# Patient Record
Sex: Male | Born: 1975 | Race: White | Hispanic: No | Marital: Married | Smoking: Never smoker
Health system: Southern US, Community
[De-identification: ages and names within clinical notes are randomized; demographics above are authoritative.]

## PROBLEM LIST (undated history)

## (undated) DIAGNOSIS — Z789 Other specified health status: Secondary | ICD-10-CM

## (undated) HISTORY — PX: BACK SURGERY: SHX140

## (undated) HISTORY — PX: APPENDECTOMY: SHX54

---

## 2005-04-09 ENCOUNTER — Observation Stay: Payer: Self-pay | Admitting: Surgery

## 2010-03-15 ENCOUNTER — Emergency Department: Payer: Self-pay | Admitting: Emergency Medicine

## 2011-04-06 ENCOUNTER — Emergency Department: Payer: Self-pay | Admitting: Emergency Medicine

## 2016-01-29 ENCOUNTER — Encounter: Payer: Self-pay | Admitting: Emergency Medicine

## 2016-01-29 ENCOUNTER — Emergency Department
Admission: EM | Admit: 2016-01-29 | Discharge: 2016-01-29 | Disposition: A | Discharge status: Home / Self Care | Payer: Self-pay | Attending: Emergency Medicine | Admitting: Emergency Medicine

## 2016-01-29 ENCOUNTER — Emergency Department: Payer: Self-pay

## 2016-01-29 DIAGNOSIS — Y929 Unspecified place or not applicable: Secondary | ICD-10-CM | POA: Insufficient documentation

## 2016-01-29 DIAGNOSIS — S92302A Fracture of unspecified metatarsal bone(s), left foot, initial encounter for closed fracture: Secondary | ICD-10-CM

## 2016-01-29 DIAGNOSIS — S92355A Nondisplaced fracture of fifth metatarsal bone, left foot, initial encounter for closed fracture: Secondary | ICD-10-CM | POA: Insufficient documentation

## 2016-01-29 DIAGNOSIS — Y9389 Activity, other specified: Secondary | ICD-10-CM | POA: Insufficient documentation

## 2016-01-29 DIAGNOSIS — Y998 Other external cause status: Secondary | ICD-10-CM | POA: Insufficient documentation

## 2016-01-29 DIAGNOSIS — X501XXA Overexertion from prolonged static or awkward postures, initial encounter: Secondary | ICD-10-CM | POA: Insufficient documentation

## 2016-01-29 MED ORDER — HYDROCODONE-ACETAMINOPHEN 5-325 MG PO TABS: 1.0000 | ORAL_TABLET | Freq: Four times a day (QID) | ORAL | Status: DC | PRN

## 2016-01-29 NOTE — ED Notes (Signed)
States he stepped off the pavement wrong couple of days ago conts to have pain to left lateral foot  Min swelling noted

## 2016-01-29 NOTE — ED Notes (Signed)
Pt c/o left foot pain on top of the foot difficulty putting sock on. Pain started on Friday. Pt states he was playing with his kids and thinks he rolled his foot.

## 2016-01-29 NOTE — Discharge Instructions (Signed)
Metatarsal Fracture A metatarsal fracture is a break in a metatarsal bone. Metatarsal bones connect your toe bones to your ankle bones. CAUSES This type of fracture may be caused by:  A sudden twisting of your foot.  A fall onto your foot.  Overuse or repetitive exercise. RISK FACTORS This condition is more likely to develop in people who:  Play contact sports.  Have a bone disease.  Have a low calcium level. SYMPTOMS Symptoms of this condition include:  Pain that is worse when walking or standing.  Pain when pressing on the foot or moving the toes.  Swelling.  Bruising on the top or bottom of the foot.  A foot that appears shorter than the other one. DIAGNOSIS This condition is diagnosed with a physical exam. You may also have imaging tests, such as:  X-rays.  A CT scan.  MRI. TREATMENT Treatment for this condition depends on its severity and whether a bone has moved out of place. Treatment may involve:  Rest.  Wearing foot support such as a cast, splint, or boot for several weeks.  Using crutches.  Surgery to move bones back into the right position. Surgery is usually needed if there are many pieces of broken bone or bones that are very out of place (displaced fracture).  Physical therapy. This may be needed to help you regain full movement and strength in your foot. You will need to return to your health care provider to have X-rays taken until your bones heal. Your health care provider will look at the X-rays to make sure that your foot is healing well. HOME CARE INSTRUCTIONS  If You Have a Cast:  Do not stick anything inside the cast to scratch your skin. Doing that increases your risk of infection.  Check the skin around the cast every day. Report any concerns to your health care provider. You may put lotion on dry skin around the edges of the cast. Do not apply lotion to the skin underneath the cast.  Keep the cast clean and dry. If You Have a Splint  or a Supportive Boot:  Wear it as directed by your health care provider. Remove it only as directed by your health care provider.  Loosen it if your toes become numb and tingle, or if they turn cold and blue.  Keep it clean and dry. Bathing  Do not take baths, swim, or use a hot tub until your health care provider approves. Ask your health care provider if you can take showers. You may only be allowed to take sponge baths for bathing.  If your health care provider approves bathing and showering, cover the cast or splint with a watertight plastic bag to protect it from water. Do not let the cast or splint get wet. Managing Pain, Stiffness, and Swelling  If directed, apply ice to the injured area (if you have a splint, not a cast).  Put ice in a plastic bag.  Place a towel between your skin and the bag.  Leave the ice on for 20 minutes, 2-3 times per day.  Move your toes often to avoid stiffness and to lessen swelling.  Raise (elevate) the injured area above the level of your heart while you are sitting or lying down. Driving  Do not drive or operate heavy machinery while taking pain medicine.  Do not drive while wearing foot support on a foot that you use for driving. Activity  Return to your normal activities as directed by your health care  provider. Ask your health care provider what activities are safe for you.  Perform exercises as directed by your health care provider or physical therapist. Safety  Do not use the injured foot to support your body weight until your health care provider says that you can. Use crutches as directed by your health care provider. General Instructions  Do not put pressure on any part of the cast or splint until it is fully hardened. This may take several hours.  Do not use any tobacco products, including cigarettes, chewing tobacco, or e-cigarettes. Tobacco can delay bone healing. If you need help quitting, ask your health care  provider.  Take medicines only as directed by your health care provider.  Keep all follow-up visits as directed by your health care provider. This is important. SEEK MEDICAL CARE IF:  You have a fever.  Your cast, splint, or boot is too loose or too tight.  Your cast, splint, or boot is damaged.  Your pain medicine is not helping.  You have pain, tingling, or numbness in your foot that is not going away. SEEK IMMEDIATE MEDICAL CARE IF:  You have severe pain.  You have tingling or numbness in your foot that is getting worse.  Your foot feels cold or becomes numb.  Your foot changes color.   This information is not intended to replace advice given to you by your health care provider. Make sure you discuss any questions you have with your health care provider.   Document Released: 04/21/2002 Document Revised: 12/14/2014 Document Reviewed: 05/26/2014 Elsevier Interactive Patient Education 2016 [place] or Splint Care Casts and splints support injured limbs and keep bones from moving while they heal.  HOME CARE  Keep the cast or splint uncovered during the drying period.  A plaster cast can take 24 to 48 hours to dry.  A fiberglass cast will dry in less than 1 hour.  Do not rest the cast on anything harder than a pillow for 24 hours.  Do not put weight on your injured limb. Do not put pressure on the cast. Wait for your doctor's approval.  Keep the cast or splint dry.  Cover the cast or splint with a plastic bag during baths or wet weather.  If you have a cast over your chest and belly (trunk), take sponge baths until the cast is taken off.  If your cast gets wet, dry it with a towel or blow dryer. Use the cool setting on the blow dryer.  Keep your cast or splint clean. Wash a dirty cast with a damp cloth.  Do not put any objects under your cast or splint.  Do not scratch the skin under the cast with an object. If itching is a problem, use a blow dryer  on a cool setting over the itchy area.  Do not trim or cut your cast.  Do not take out the padding from inside your cast.  Exercise your joints near the cast as told by your doctor.  Raise (elevate) your injured limb on 1 or 2 pillows for the first 1 to 3 days. GET HELP IF:  Your cast or splint cracks.  Your cast or splint is too tight or too loose.  You itch badly under the cast.  Your cast gets wet or has a soft spot.  You have a bad smell coming from the cast.  You get an object stuck under the cast.  Your skin around the cast becomes red or sore.  You have new or more pain after the cast is put on. °GET HELP RIGHT AWAY IF: °· You have fluid leaking through the cast. °· You cannot move your fingers or toes. °· Your fingers or toes turn blue or white or are cool, painful, or puffy (swollen). °· You have tingling or lose feeling (numbness) around the injured area. °· You have bad pain or pressure under the cast. °· You have trouble breathing or have shortness of breath. °· You have chest pain. °  °This information is not intended to replace advice given to you by your health care provider. Make sure you discuss any questions you have with your health care provider. °  °Document Released: 11/29/2010 Document Revised: 04/01/2013 Document Reviewed: 02/05/2013 °Elsevier Interactive Patient Education ©2016 Elsevier Inc. ° °

## 2016-01-29 NOTE — ED Provider Notes (Signed)
[facility] Emergency Department Provider Note  ____________________________________________  Time seen: Approximately 12:59 PM  I have reviewed the triage vital signs and the nursing notes.   HISTORY  Chief Complaint Foot Pain    HPI Philip Gomez is a 40 y.o. male , NAD, presents to the emergency department with several day history of left foot pain. He was playing with his children and believes he rolled his left foot. Has had pain, swelling and bruising about the outside of his foot since that time. Has had difficulty bearing weight due to the pain. Denies open wounds, skin sores, redness. Hasnot had any numbness, weakness, tingling.   History reviewed. No pertinent past medical history.  There are no active problems to display for this patient.   History reviewed. No pertinent past surgical history.  No current outpatient prescriptions on file.  Allergies Penicillins  History reviewed. No pertinent family history.  Social History Social History  Substance Use Topics  . Smoking status: Never Smoker   . Smokeless tobacco: None  . Alcohol Use: Yes     Review of Systems  Constitutional: No fever/chills, fatigue Cardiovascular: No chest pain. Respiratory:  No shortness of breath.  Musculoskeletal: Positive for left foot pain.  Skin: Positive bruising, swelling left foot. Negative for rash or open wounds. Neurological: Negative for headaches, focal weakness or numbness. No tingling 10-point ROS otherwise negative.  ____________________________________________   PHYSICAL EXAM:  VITAL SIGNS: ED Triage Vitals  Enc Vitals Group     BP 01/29/16 1240 147/90 mmHg     Pulse Rate 01/29/16 1240 103     Resp 01/29/16 1240 20     Temp 01/29/16 1240 98.3 F (36.8 C)     Temp Source 01/29/16 1240 Oral     SpO2 01/29/16 1240 100 %     Weight 01/29/16 1240 195 lb (88.451 kg)     Height 01/29/16 1240 6' (1.829 m)     Head Cir --    Peak Flow --      Pain Score 01/29/16 1242 2     Pain Loc --      Pain Edu? --      Excl. in GC? --      Constitutional: Alert and oriented. Well appearing and in no acute distress. Eyes: Conjunctivae are normal.  Head: Atraumatic. Cardiovascular: Good peripheral circulation with 2+ pulses noted in the left lower extremity. Capillary refill is brisk in all edges of the left lower extremity Respiratory: Normal respiratory effort without tachypnea or retractions.  Musculoskeletal: Tenderness to palpation about the lateral left foot with increasing pain about the fifth metatarsal. No crepitus or bony at rally to palpation. Mild edema about the proximal fifth metatarsal. No lower extremity tenderness nor edema.  No joint effusions. Neurologic:  Normal speech and language. No gross focal neurologic deficits are appreciated. No tingling. Sensation to light touch is grossly intact about the left foot. Skin:  About lateral left foot is with mild blue ecchymosis and minor swelling. Skin is warm, dry and intact. No rash noted. Psychiatric: Mood and affect are normal. Speech and behavior are normal. Patient exhibits appropriate insight and judgement.   ____________________________________________   LABS  None ____________________________________________  EKG  None ____________________________________________  RADIOLOGY I have personally viewed and evaluated these images (plain radiographs) as part of my medical decision making, as well as reviewing the written report by the radiologist.  No results found.  ____________________________________________    PROCEDURES  Procedure(s) performed: None  Medications - No data to display   ____________________________________________   INITIAL IMPRESSION / ASSESSMENT AND PLAN / ED COURSE  Pertinent imaging results that were available during my care of the patient were reviewed by me and considered in my medical decision making (see  chart for details).  Patient's diagnosis is consistent with closed fracture of the fifth metatarsal. Patient was placed in a posterior short-leg splint and given crutches to ambulate. He is to keep the left lower extremity elevated when not ambulating and apply ice as needed. She declined prescription for pain medication stating he had an issue with pain medications a few years back. He will take Tylenol and ibuprofen over-the-counter as needed for pain. Patient was given a work note to excuse him until he was cleared with or without restrictions based on his orthopedic follow-up. Patient is to follow up with Dr. Joice Lofts in orthopedics within 3 days for further evaluation and treatment. Patient is given ED precautions to return to the ED for any worsening or new symptoms.    ____________________________________________  FINAL CLINICAL IMPRESSION(S) / ED DIAGNOSES  Final diagnoses:  Fracture of fifth metatarsal bone, left, closed, initial encounter      NEW MEDICATIONS STARTED DURING THIS VISIT:  There are no discharge medications for this patient.        Hope Pigeon, PA-C 01/29/16 1339  Sharyn Creamer, MD 01/29/16 [phone]

## 2016-06-06 NOTE — Discharge Instructions (Signed)
[facility] °[facility] ° °POST OPERATIVE INSTRUCTIONS FOR DR. TROXLER AND DR. FOWLER °[facility] ° ° °1. Take your medication as prescribed.  Pain medication should be taken only as needed. ° °2. Keep the dressing clean, dry and intact. ° °3. Keep your foot elevated above the heart level for the first 48 hours. ° °4. Walking to the bathroom and brief periods of walking are acceptable, unless we have instructed you to be non-weight bearing. ° °5. Always wear your post-op shoe when walking.  Always use your crutches if you are to be non-weight bearing. ° °6. Do not take a shower. Baths are permissible as long as the foot is kept out of the water.  ° °7. Every hour you are awake:  °- Bend your knee 15 times. °- Flex foot 15 times °- Massage calf 15 times ° °8. Call [facility] ([phone]) if any of the following problems occur: °- You develop a temperature or fever. °- The bandage becomes saturated with blood. °- Medication does not stop your pain. °- Injury of the foot occurs. °- Any symptoms of infection including redness, odor, or red streaks running from wound. ° °General Anesthesia, Adult, Care After °Refer to this sheet in the next few weeks. These instructions provide you with information on caring for yourself after your procedure. Your health care provider may also give you more specific instructions. Your treatment has been planned according to current medical practices, but problems sometimes occur. Call your health care provider if you have any problems or questions after your procedure. °WHAT TO EXPECT AFTER THE PROCEDURE °After the procedure, it is typical to experience: °· Sleepiness. °· Nausea and vomiting. °HOME CARE INSTRUCTIONS °· For the first 24 hours after general anesthesia: °¨ Have a responsible person with you. °¨ Do not drive a car. If you are alone, do not take public transportation. °¨ Do not drink alcohol. °¨ Do not take  medicine that has not been prescribed by your health care provider. °¨ Do not sign important papers or make important decisions. °¨ You may resume a normal diet and activities as directed by your health care provider. °· Change bandages (dressings) as directed. °· If you have questions or problems that seem related to general anesthesia, call the hospital and ask for the anesthetist or anesthesiologist on call. °SEEK MEDICAL CARE IF: °· You have nausea and vomiting that continue the day after anesthesia. °· You develop a rash. °SEEK IMMEDIATE MEDICAL CARE IF:  °· You have difficulty breathing. °· You have chest pain. °· You have any allergic problems. °  °This information is not intended to replace advice given to you by your health care provider. Make sure you discuss any questions you have with your health care provider. °  °Document Released: 11/05/2000 Document Revised: 08/20/2014 Document Reviewed: 11/28/2011 °Elsevier Interactive Patient Education ©2016 Elsevier Inc. ° °

## 2016-06-07 ENCOUNTER — Encounter: Payer: Self-pay | Admitting: Anesthesiology

## 2016-06-07 ENCOUNTER — Encounter: Payer: Self-pay | Admitting: *Deleted

## 2016-06-13 ENCOUNTER — Ambulatory Visit: Admission: RE | Admit: 2016-06-13 | Payer: Worker's Compensation | Source: Ambulatory Visit | Admitting: Podiatry

## 2016-06-13 HISTORY — DX: Other specified health status: Z78.9

## 2016-06-13 SURGERY — OPEN REDUCTION INTERNAL FIXATION (ORIF) METATARSAL (TOE) FRACTURE
Anesthesia: General | Laterality: Left

## 2016-07-02 ENCOUNTER — Encounter: Payer: Self-pay | Admitting: *Deleted

## 2016-07-11 ENCOUNTER — Ambulatory Visit: Payer: Worker's Compensation | Admitting: Anesthesiology

## 2016-07-11 ENCOUNTER — Encounter
Admission: RE | Disposition: A | Discharge status: Home / Self Care | Payer: Self-pay | Source: Ambulatory Visit | Attending: Podiatry

## 2016-07-11 ENCOUNTER — Ambulatory Visit
Admission: RE | Admit: 2016-07-11 | Discharge: 2016-07-11 | Disposition: A | Discharge status: Home / Self Care | Payer: Worker's Compensation | Source: Ambulatory Visit | Attending: Podiatry | Admitting: Podiatry

## 2016-07-11 DIAGNOSIS — Z79899 Other long term (current) drug therapy: Secondary | ICD-10-CM | POA: Diagnosis not present

## 2016-07-11 DIAGNOSIS — X58XXXA Exposure to other specified factors, initial encounter: Secondary | ICD-10-CM | POA: Insufficient documentation

## 2016-07-11 DIAGNOSIS — S92352A Displaced fracture of fifth metatarsal bone, left foot, initial encounter for closed fracture: Secondary | ICD-10-CM | POA: Diagnosis present

## 2016-07-11 HISTORY — PX: OPEN REDUCTION INTERNAL FIXATION (ORIF) FOOT LISFRANC FRACTURE: SHX5990

## 2016-07-11 SURGERY — OPEN REDUCTION INTERNAL FIXATION (ORIF) FOOT LISFRANC FRACTURE
Anesthesia: General | Site: Fifth Toe | Laterality: Left | Wound class: Clean

## 2016-07-11 MED ORDER — BUPIVACAINE HCL (PF) 0.25 % IJ SOLN
INTRAMUSCULAR | Status: DC | PRN
  Administered 2016-07-11: 10 mL

## 2016-07-11 MED ORDER — PROPOFOL 10 MG/ML IV BOLUS
INTRAVENOUS | Status: DC | PRN
  Administered 2016-07-11: 150 mg via INTRAVENOUS

## 2016-07-11 MED ORDER — MIDAZOLAM HCL 5 MG/5ML IJ SOLN: INTRAMUSCULAR | Status: DC | PRN

## 2016-07-11 MED ORDER — LIDOCAINE HCL (CARDIAC) 20 MG/ML IV SOLN
INTRAVENOUS | Status: DC | PRN
  Administered 2016-07-11: 40 mg via INTRATRACHEAL

## 2016-07-11 MED ORDER — MIDAZOLAM HCL 2 MG/2ML IJ SOLN
INTRAMUSCULAR | Status: DC | PRN
  Administered 2016-07-11 (×2): 2 mg via INTRAVENOUS

## 2016-07-11 MED ORDER — ROPIVACAINE HCL 5 MG/ML IJ SOLN
INTRAMUSCULAR | Status: DC | PRN
  Administered 2016-07-11: 30 mL via PERINEURAL

## 2016-07-11 MED ORDER — LACTATED RINGERS IV SOLN
INTRAVENOUS | Status: DC | PRN
  Administered 2016-07-11 (×2): via INTRAVENOUS

## 2016-07-11 MED ORDER — FENTANYL CITRATE (PF) 100 MCG/2ML IJ SOLN
INTRAMUSCULAR | Status: DC | PRN
  Administered 2016-07-11: 100 ug via INTRAVENOUS

## 2016-07-11 MED ORDER — IBUPROFEN 800 MG PO TABS: 800.0000 mg | ORAL_TABLET | Freq: Three times a day (TID) | ORAL | 0 refills | Status: DC | PRN

## 2016-07-11 MED ORDER — GLYCOPYRROLATE 0.2 MG/ML IJ SOLN
INTRAMUSCULAR | Status: DC | PRN
  Administered 2016-07-11: 0.1 mg via INTRAVENOUS

## 2016-07-11 MED ORDER — LIDOCAINE-EPINEPHRINE 1 %-1:100000 IJ SOLN
INTRAMUSCULAR | Status: DC | PRN
  Administered 2016-07-11: 5 mL

## 2016-07-11 MED ORDER — DEXTROSE 5 % IV SOLN
600.0000 mg | Freq: Once | INTRAVENOUS | Status: AC
  Administered 2016-07-11: 600 mg via INTRAVENOUS

## 2016-07-11 MED ORDER — ONDANSETRON HCL 4 MG/2ML IJ SOLN
INTRAMUSCULAR | Status: DC | PRN
  Administered 2016-07-11: 4 mg via INTRAVENOUS

## 2016-07-11 MED ORDER — EPHEDRINE SULFATE 50 MG/ML IJ SOLN
INTRAMUSCULAR | Status: DC | PRN
  Administered 2016-07-11 (×3): 5 mg via INTRAVENOUS

## 2016-07-11 MED ORDER — DEXAMETHASONE SODIUM PHOSPHATE 4 MG/ML IJ SOLN
INTRAMUSCULAR | Status: DC | PRN
  Administered 2016-07-11: 4 mg via INTRAVENOUS

## 2016-07-11 SURGICAL SUPPLY — 50 items
BANDAGE ELASTIC 4 LF NS (GAUZE/BANDAGES/DRESSINGS) ×2 IMPLANT
BIT DRILL CANNULATED 3.8MM (DRILL) IMPLANT
BNDG CMPR 75X41 PLY HI ABS (GAUZE/BANDAGES/DRESSINGS) ×1
BNDG CMPR MED 5X4 ELC HKLP NS (GAUZE/BANDAGES/DRESSINGS) ×1
BNDG COHESIVE 4X5 TAN STRL (GAUZE/BANDAGES/DRESSINGS) ×1 IMPLANT
BNDG ESMARK 4X12 TAN STRL LF (GAUZE/BANDAGES/DRESSINGS) ×2 IMPLANT
BNDG GAUZE 4.5X4.1 6PLY STRL (MISCELLANEOUS) ×2 IMPLANT
BNDG STRETCH 4X75 STRL LF (GAUZE/BANDAGES/DRESSINGS) ×2 IMPLANT
CANISTER SUCT 1200ML W/VALVE (MISCELLANEOUS) ×2 IMPLANT
COUNTER SINK 5.5 (MISCELLANEOUS) ×2
CUFF TOURN SGL QUICK 18 (TOURNIQUET CUFF) ×1 IMPLANT
DRAPE FLUOR MINI C-ARM 54X84 (DRAPES) ×2 IMPLANT
DRILL CANNULATED 3.8MM (DRILL) ×2
DURAPREP 26ML APPLICATOR (WOUND CARE) ×2 IMPLANT
GAUZE PETRO XEROFOAM 1X8 (MISCELLANEOUS) ×2 IMPLANT
GAUZE SPONGE 4X4 12PLY STRL (GAUZE/BANDAGES/DRESSINGS) ×2 IMPLANT
GLOVE BIO SURGEON STRL SZ7.5 (GLOVE) ×3 IMPLANT
GLOVE INDICATOR 8.0 STRL GRN (GLOVE) ×3 IMPLANT
GOWN STRL REUS W/ TWL LRG LVL3 (GOWN DISPOSABLE) ×2 IMPLANT
GOWN STRL REUS W/TWL LRG LVL3 (GOWN DISPOSABLE) ×4
JONES SCREW, SOLID 5.5 X 48 (Screw) IMPLANT
K-WIRE DBL END TROCAR 6X.045 (WIRE) ×2
KIT ROOM TURNOVER OR (KITS) ×2 IMPLANT
KWIRE DBL END TROCAR 6X.045 (WIRE) IMPLANT
NDL FILTER BLUNT 18X1 1/2 (NEEDLE) ×1 IMPLANT
NDL HYPO 27GX1-1/4 (NEEDLE) ×1 IMPLANT
NEEDLE FILTER BLUNT 18X 1/2SAF (NEEDLE)
NEEDLE FILTER BLUNT 18X1 1/2 (NEEDLE) IMPLANT
NEEDLE HYPO 27GX1-1/4 (NEEDLE) IMPLANT
NS IRRIG 500ML POUR BTL (IV SOLUTION) ×2 IMPLANT
PACK EXTREMITY ARMC (MISCELLANEOUS) ×2 IMPLANT
PAD GROUND ADULT SPLIT (MISCELLANEOUS) ×2 IMPLANT
SCREW COUNTERSINK 5.5 (MISCELLANEOUS) IMPLANT
SCREW JONES SOLID 5.5X48 (Screw) ×1 IMPLANT
STIMULATOR BONE (ORTHOPEDIC SUPPLIES) ×1
STIMULATOR BONE GROWTH EMG EXT (ORTHOPEDIC SUPPLIES) IMPLANT
STOCKINETTE STRL 6IN 960660 (GAUZE/BANDAGES/DRESSINGS) ×1 IMPLANT
STRAP BODY AND KNEE 60X3 (MISCELLANEOUS) ×2 IMPLANT
STRIP CLOSURE SKIN 1/4X4 (GAUZE/BANDAGES/DRESSINGS) ×2 IMPLANT
SUT ETHILON 4-0 (SUTURE)
SUT ETHILON 4-0 FS2 18XMFL BLK (SUTURE)
SUT ETHILON 5-0 FS-2 18 BLK (SUTURE) IMPLANT
SUT MNCRL+ 5-0 UNDYED PC-3 (SUTURE) IMPLANT
SUT MONOCRYL 5-0 (SUTURE)
SUT VIC AB 4-0 FS2 27 (SUTURE) ×1 IMPLANT
SUT VICRYL AB 3-0 FS1 BRD 27IN (SUTURE) IMPLANT
SUTURE ETHLN 4-0 FS2 18XMF BLK (SUTURE) IMPLANT
SYRINGE 10CC LL (SYRINGE) ×1 IMPLANT
TAP 5.5 (TAP) ×1 IMPLANT
WIRE SMOOTH NITINOL 1.6X200 (WIRE) ×1 IMPLANT

## 2016-07-11 NOTE — Addendum Note (Signed)
Addendum  created 07/11/16 1624 by Scarlette Sliceachel B Beach, MD   Anesthesia Intra Blocks edited, Child order released for a procedure order, Sign clinical note

## 2016-07-11 NOTE — Anesthesia Preprocedure Evaluation (Signed)
Anesthesia Evaluation  Patient identified by MRN, date of birth, ID band Patient awake    Reviewed: Allergy & Precautions, H&P , NPO status , Patient's Chart, lab work & pertinent test results, reviewed documented beta blocker date and time   Airway Mallampati: II  TM Distance: >3 FB Neck ROM: full    Dental no notable dental hx.    Pulmonary neg pulmonary ROS,    Pulmonary exam normal breath sounds clear to auscultation       Cardiovascular Exercise Tolerance: Good hypertension,  Rhythm:regular Rate:Normal     Neuro/Psych negative neurological ROS  negative psych ROS   GI/Hepatic negative GI ROS, Neg liver ROS,   Endo/Other  negative endocrine ROS  Renal/GU negative Renal ROS  negative genitourinary   Musculoskeletal   Abdominal   Peds  Hematology negative hematology ROS (+)   Anesthesia Other Findings   Reproductive/Obstetrics negative OB ROS                             Anesthesia Physical Anesthesia Plan  ASA: II  Anesthesia Plan: General   Post-op Pain Management: GA combined w/ Regional for post-op pain   Induction:   Airway Management Planned:   Additional Equipment:   Intra-op Plan:   Post-operative Plan:   Informed Consent: I have reviewed the patients History and Physical, chart, labs and discussed the procedure including the risks, benefits and alternatives for the proposed anesthesia with the patient or authorized representative who has indicated his/her understanding and acceptance.   Dental Advisory Given  Plan Discussed with: CRNA  Anesthesia Plan Comments:         Anesthesia Quick Evaluation

## 2016-07-11 NOTE — H&P (Signed)
  Philip Gomez is an 40 y.o. male.   Chief Complaint:  <principal problem not specified>   HPI: Pt with left foot Jones fracture.  Past Medical History:  Diagnosis Date  . Medical history non-contributory     Past Surgical History:  Procedure Laterality Date  . APPENDECTOMY    . BACK SURGERY     L4-5 "scraped"    History reviewed. No pertinent family history. Social History:  reports that he has never smoked. He has never used smokeless tobacco. He reports that he drinks about 3.6 oz of alcohol per week . His drug history is not on file.  Allergies:  Allergies  Allergen Reactions  . Penicillins     Unknown reaction (as child)    ROS  Medications Prior to Admission  Medication Sig Dispense Refill  . dexmethylphenidate (FOCALIN) 10 MG tablet Take 10 mg by mouth 2 (two) times daily.      Physical Exam: General: Alert and oriented.  No apparent distress. CV:  RRR Lungs:  CTA b/l Vascular:  Left foot:Dorsalis Pedis:  present Posterior Tibial:  present  Right foot: not evaluated Neuro:intact Derm:No open wound Ortho/MS: Pain 5th metatarsal base.     No results found for this or any previous visit (from the past 48 hour(s)). No results found.  Blood pressure (!) 140/95, pulse 91, temperature 98.4 F (36.9 C), temperature source Tympanic, resp. rate 16, height 6\' 1"  (1.854 m), weight 93 kg (205 lb), SpO2 98 %.  Assessment/Plan Jones fracture left   Plan for ORIF . No changes in medical history.  Gwyneth RevelsFowler, Angus Amini A 07/11/2016, 1:56 PM

## 2016-07-11 NOTE — Progress Notes (Signed)
[facility] ultrasound guided, popliteal block. Side rails up, monitors on throughout procedure. See vital signs in flow sheet. Tolerated Procedure well.

## 2016-07-11 NOTE — Discharge Instructions (Signed)
[facility]  POST OPERATIVE INSTRUCTIONS FOR DR. TROXLER AND DR. Genevieve NorlanderFOWLER [facility]   1. Take your medication as prescribed.  Pain medication should be taken only as needed.  2. Keep the dressing clean, dry and intact.  3. Keep your foot elevated above the heart level for the first 48 hours.  4. Walking to the bathroom and brief periods of walking are acceptable, unless we have instructed you to be non-weight bearing.  5. Always wear your post-op shoe when walking.  Always use your crutches if you are to be non-weight bearing.  6. Every hour you are awake:  - Bend your knee 15 times. - Massage calf 15 times  7. [facility] [phone]) if any of the following problems occur: - You develop a temperature or fever. - The bandage becomes saturated with blood. - Medication does not stop your pain. - Injury of the foot occurs. - Any symptoms of infection including redness, odor, or red streaks running from wound.  General Anesthesia, Adult, Care After These instructions provide you with information about caring for yourself after your procedure. Your health care provider may also give you more specific instructions. Your treatment has been planned according to current medical practices, but problems sometimes occur. Call your health care provider if you have any problems or questions after your procedure. What can I expect after the procedure? After the procedure, it is common to have:  Vomiting.  A sore throat.  Mental slowness. It is common to feel:  Nauseous.  Cold or shivery.  Sleepy.  Tired.  Sore or achy, even in parts of your body where you did not have surgery. Follow these instructions at home: For at least 24 hours after the procedure:  Do not:  Participate in activities where you could fall or become injured.  Drive.  Use heavy machinery.  Drink alcohol.  Take sleeping  pills or medicines that cause drowsiness.  Make important decisions or sign legal documents.  Take care of children on your own.  Rest. Eating and drinking  If you vomit, drink water, juice, or soup when you can drink without vomiting.  Drink enough fluid to keep your urine clear or pale yellow.  Make sure you have little or no nausea before eating solid foods.  Follow the diet recommended by your health care provider. General instructions  Have a responsible adult stay with you until you are awake and alert.  Return to your normal activities as told by your health care provider. Ask your health care provider what activities are safe for you.  Take over-the-counter and prescription medicines only as told by your health care provider.  If you smoke, do not smoke without supervision.  Keep all follow-up visits as told by your health care provider. This is important. Contact a health care provider if:  You continue to have nausea or vomiting at home, and medicines are not helpful.  You cannot drink fluids or start eating again.  You cannot urinate after 8-12 hours.  You develop a skin rash.  You have fever.  You have increasing redness at the site of your procedure. Get help right away if:  You have difficulty breathing.  You have chest pain.  You have unexpected bleeding.  You feel that you are having a life-threatening or urgent problem. This information is not intended to replace advice given to you by your health care provider. Make sure you discuss any questions you  have with your health care provider. Document Released: 11/05/2000 Document Revised: 01/02/2016 Document Reviewed: 07/14/2015 Elsevier Interactive Patient Education  2017 [employer]

## 2016-07-11 NOTE — Anesthesia Postprocedure Evaluation (Signed)
Anesthesia Post Note  Patient: Alvina FilbertSteven L Maser  Procedure(s) Performed: Procedure(s) (LRB): OPEN REDUCTION INTERNAL FIXATION (ORIF) FOOT METATARSAL FRACTURE 5TH LEFT FOOT (Left)  Patient location during evaluation: PACU Anesthesia Type: General and Regional Level of consciousness: awake and alert Pain management: pain level controlled Vital Signs Assessment: post-procedure vital signs reviewed and stable Respiratory status: spontaneous breathing, nonlabored ventilation, respiratory function stable and patient connected to nasal cannula oxygen Cardiovascular status: blood pressure returned to baseline and stable Postop Assessment: no signs of nausea or vomiting Anesthetic complications: no    Scarlette Sliceachel B Beach

## 2016-07-11 NOTE — Transfer of Care (Signed)
Immediate Anesthesia Transfer of Care Note  Patient: Philip Gomez  Procedure(s) Performed: Procedure(s) with comments: OPEN REDUCTION INTERNAL FIXATION (ORIF) FOOT METATARSAL FRACTURE 5TH LEFT FOOT (Left) - POPLITEAL  Patient Location: PACU  Anesthesia Type: General  Level of Consciousness: awake, alert  and patient cooperative  Airway and Oxygen Therapy: Patient Spontanous Breathing and Patient connected to supplemental oxygen  Post-op Assessment: Post-op Vital signs reviewed, Patient's Cardiovascular Status Stable, Respiratory Function Stable, Patent Airway and No signs of Nausea or vomiting  Post-op Vital Signs: Reviewed and stable  Complications: No apparent anesthesia complications

## 2016-07-11 NOTE — Anesthesia Procedure Notes (Addendum)
Procedure Name: LMA Insertion Date/Time: 07/11/2016 2:15 PM Performed by: Jimmy PicketAMYOT, Tyresha Fede Pre-anesthesia Checklist: Patient identified, Emergency Drugs available, Suction available, Timeout performed and Patient being monitored Patient Re-evaluated:Patient Re-evaluated prior to inductionOxygen Delivery Method: Circle system utilized Preoxygenation: Pre-oxygenation with 100% oxygen Intubation Type: IV induction LMA: LMA inserted LMA Size: 4.0 Number of attempts: 1 Placement Confirmation: positive ETCO2 and breath sounds checked- equal and bilateral Tube secured with: Tape

## 2016-07-11 NOTE — Addendum Note (Signed)
Addendum  created 07/11/16 1627 by Scarlette Sliceachel B Dyanne Yorks, MD   Anesthesia Review and Sign - Ready for Procedure

## 2016-07-11 NOTE — Addendum Note (Signed)
Addendum  created 07/11/16 1626 by Scarlette Sliceachel B Beach, MD   Anesthesia Intra Meds edited

## 2016-07-11 NOTE — Op Note (Signed)
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 Open reduction with internal fixation nonunion Jones fracture with intramedullary screw 2. Placement of bone stimulator external    EBL: Minimal    Anesthesia:regional and general    Hemostasis: Epinephrine infiltrated along the incision site    Specimen: None    Complications: None    Operative indications:Philip L Creta LevinStallings is an 40 y.o. that presents today for surgical intervention.  The risks/benefits/alternatives/complications have been discussed and consent has been given.    Procedure:  Patient was brought into the OR and placed on the operating table in thesupine position. After anesthesia was obtained theleft lower extremity was prepped and draped in usual sterile fashion.  Attention was directed to the dorsal lateral aspect of the left foot where just proximal to the fifth metatarsal base a 2 cm incision was performed. Sharp and blunt dissection carried down to the deeper tissue. Care was taken to retract all vital neurovascular structures. The peroneal tendon was noted at this time. This time the guidewire for the 5.5 mm screw was then entered into the base of the fifth metatarsal. The standard high and inside approach could not be used secondary to the angulation of the fifth metatarsal at the fracture site. A more lateral fifth metatarsal base insertional site was used. A limited across the fracture site with the guidewire. Next a countersink was used. The area was drilled and tapped for a 5.5 solid screw. The guidewire was then removed. A 48 mm x 5.5 mm solid screw was used. Good alignment was noted. This was noted be very stable Intra-Op. Screw threads were across the fracture site. Multiple views of fluoroscopy did no good alignment of the screw. Time the wound was flushed with copious amounts or irrigation.  Layered closure was performed with a 4-0 Vicryl for the deeper layers and a 5-0 Monocryl undyed. 0.25% Marcaine was used around all areas. A well compressive sterile dressing was applied. After placement of his walking boot I placed a external bone stimulator.    Patient tolerated the procedure and anesthesia well.  Was transported from the OR to the PACU with all vital signs stable and vascular status intact. To be discharged per routine protocol.  Will follow up in approximately 1 week in the outpatient clinic.

## 2016-07-11 NOTE — Anesthesia Procedure Notes (Signed)
Anesthesia Regional Block:  Popliteal block  Pre-Anesthetic Checklist: ,, timeout performed, Correct Patient, Correct Site, Correct Laterality, Correct Procedure, Correct Position, site marked, Risks and benefits discussed,  Surgical consent,  Pre-op evaluation,  At surgeon's request and post-op pain management  Laterality: Left  Prep: chloraprep       Needles:  Injection technique: Single-shot  Needle Type: Echogenic Needle     Needle Length: 9cm 9 cm Needle Gauge: 21 and 21 G    Additional Needles:  Procedures: ultrasound guided (picture in chart) Popliteal block Narrative:  Start time: 07/11/2016 1:00 PM End time: 07/11/2016 1:10 PM Injection made incrementally with aspirations every 5 mL.  Performed by: Personally   Additional Notes: Functioning IV was confirmed and monitors applied. Ultrasound guidance: relevant anatomy identified, needle position confirmed, local anesthetic spread visualized around nerve(s)., vascular puncture avoided.  Image printed for medical record.  Negative aspiration and no paresthesias; incremental administration of local anesthetic. The patient tolerated the procedure well. Vitals signes recorded in RN notes.

## 2016-07-12 ENCOUNTER — Encounter: Payer: Self-pay | Admitting: Podiatry

## 2016-07-23 NOTE — Addendum Note (Signed)
Addendum  created 07/23/16 1350 by Andee PolesWendy Willadean Guyton, CRNA   Anesthesia Intra Meds edited

## 2016-10-28 IMAGING — DX DG FOOT COMPLETE 3+V*L*
3 series · 3 of 3 positions shown · non-contrast
Comparison: None.

CLINICAL DATA: Pt c/o left foot pain on top of the foot difficulty
putting sock on. Pain started on [REDACTED]. Pt states he was playing
with his kids and thinks he rolled his foot.

EXAM:
LEFT FOOT - COMPLETE 3+ VIEW

[foot ap]
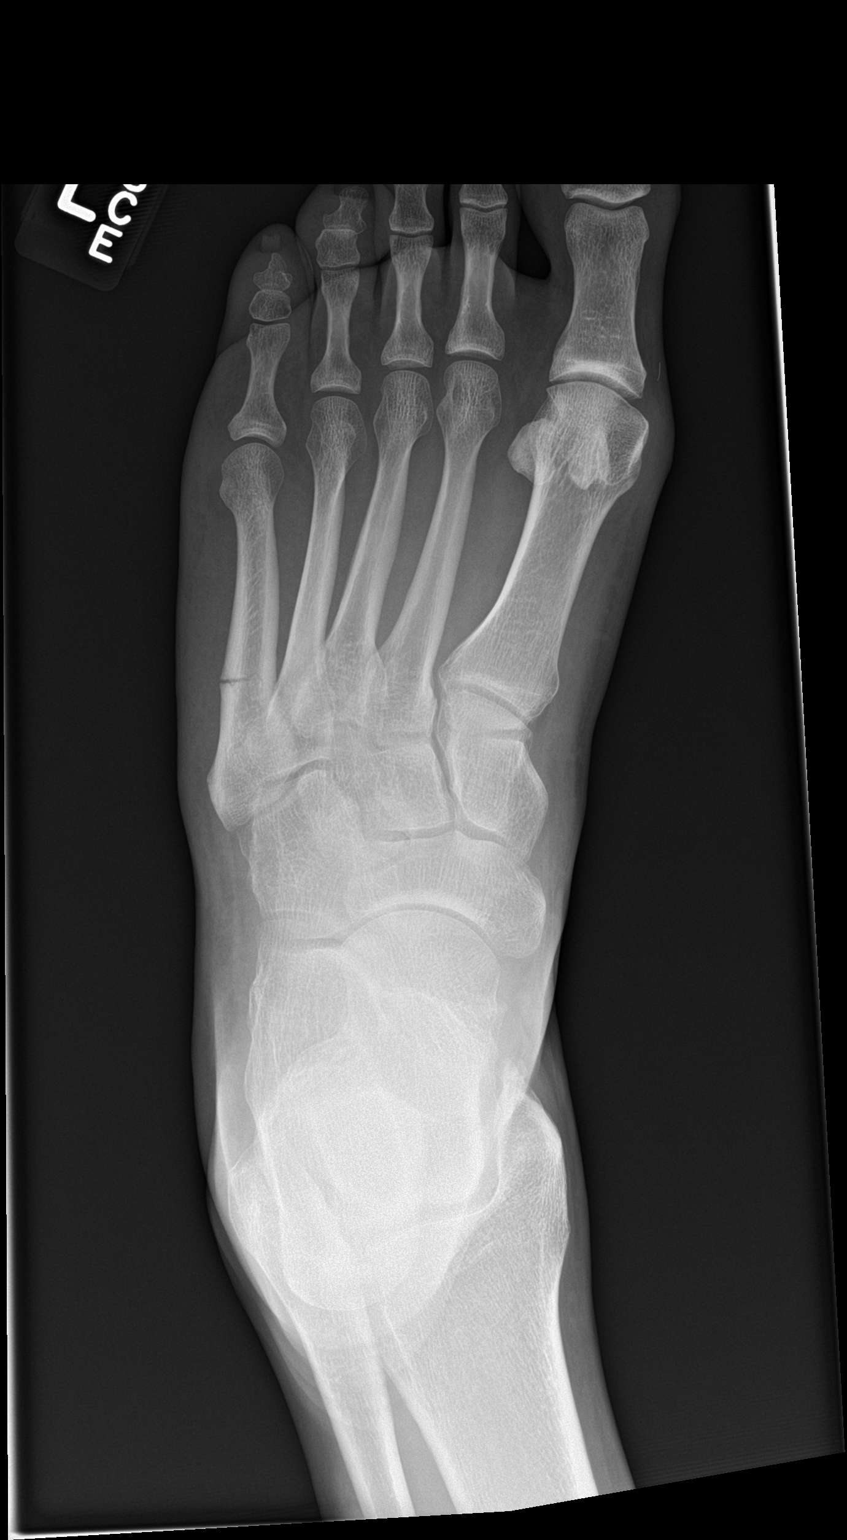

[foot obl]
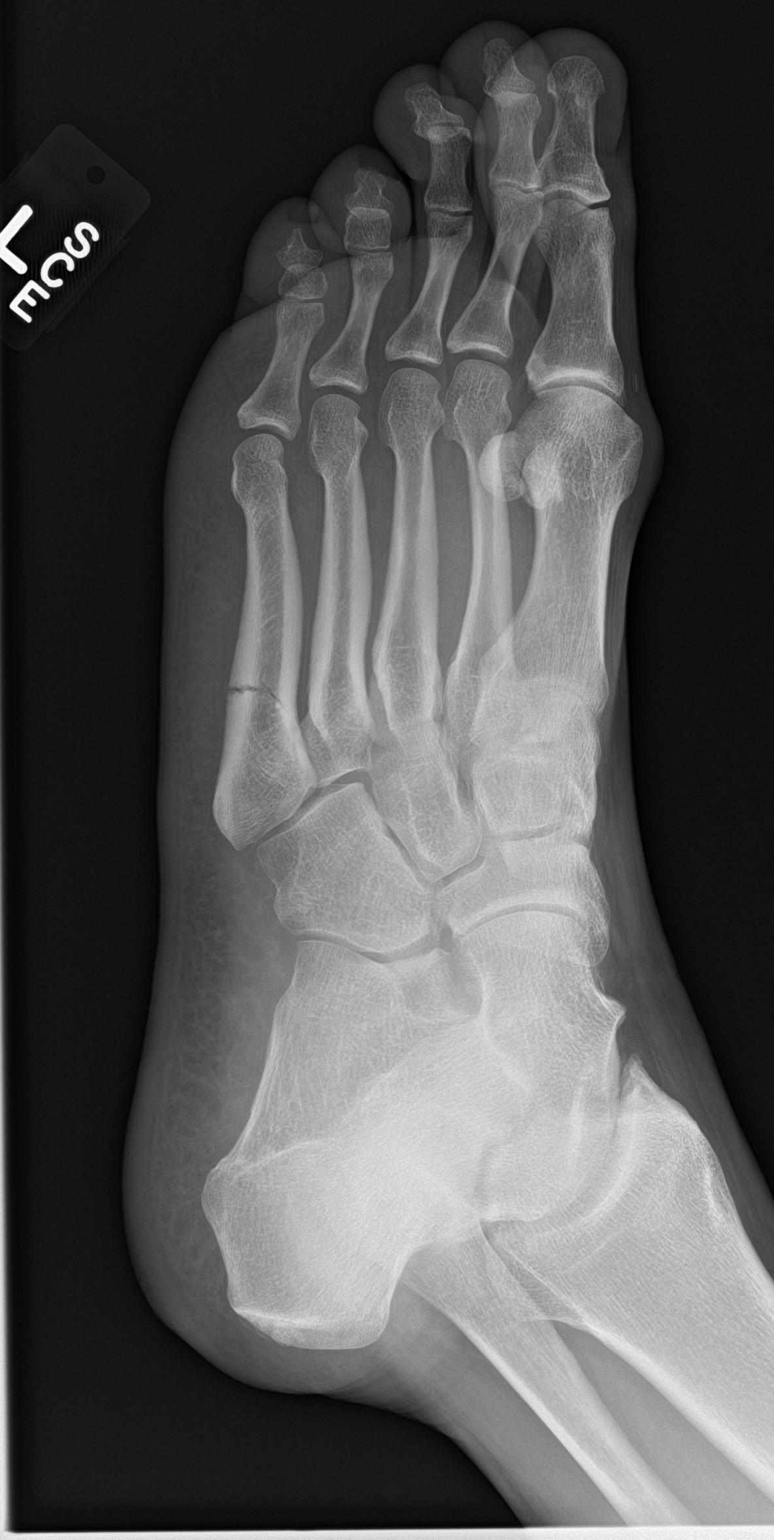

[foot lat]
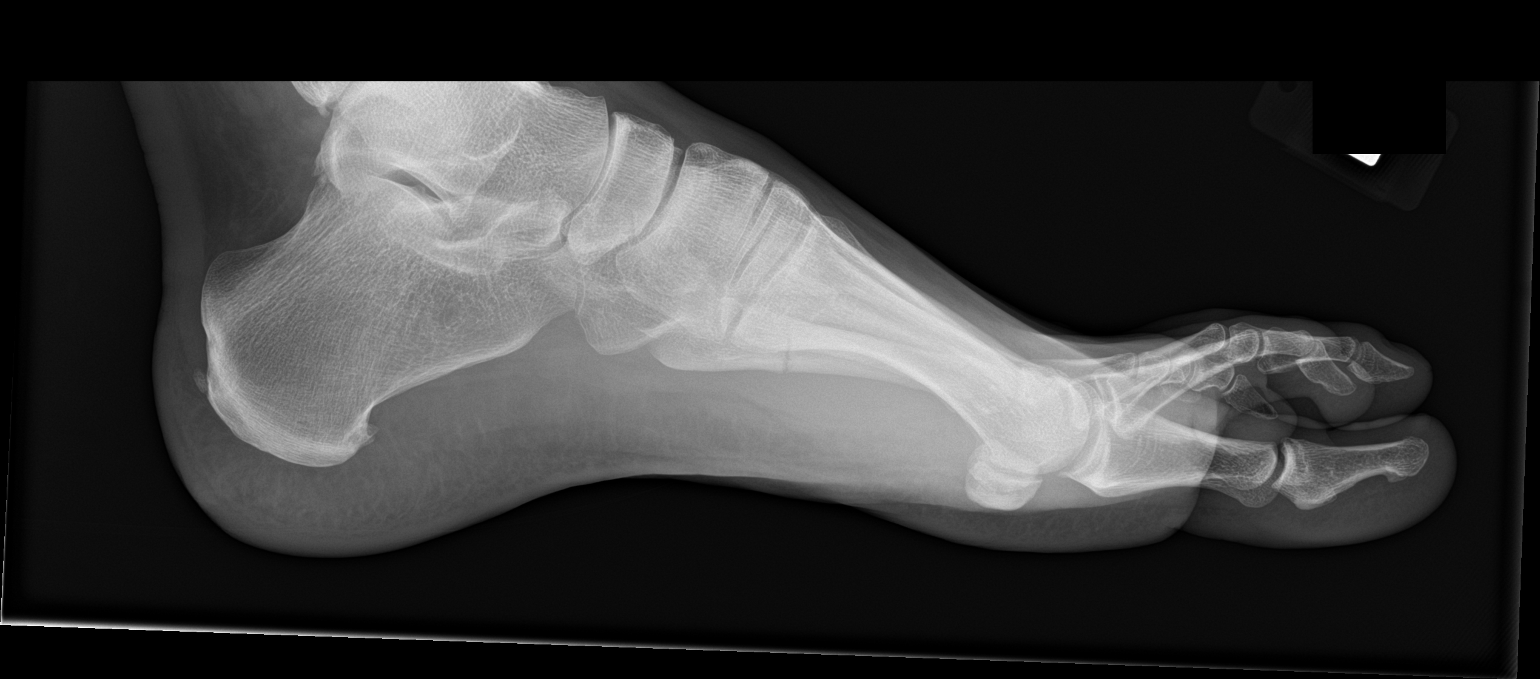

[3 of 3 positions shown; findings below may reference images not displayed]

FINDINGS: There is a nondisplaced fracture of the proximal shaft of the fifth
metatarsal.

No other fractures.  Joints are normally spaced and aligned.
IMPRESSION: Nondisplaced fracture of the proximal shaft of the left fifth
metatarsal.

## 2017-01-30 ENCOUNTER — Emergency Department
Admission: EM | Admit: 2017-01-30 | Discharge: 2017-01-30 | Disposition: A | Discharge status: Home / Self Care | Payer: Self-pay | Attending: Emergency Medicine | Admitting: Emergency Medicine

## 2017-01-30 ENCOUNTER — Emergency Department: Payer: Self-pay

## 2017-01-30 ENCOUNTER — Encounter: Payer: Self-pay | Admitting: Medical Oncology

## 2017-01-30 DIAGNOSIS — Z791 Long term (current) use of non-steroidal anti-inflammatories (NSAID): Secondary | ICD-10-CM | POA: Insufficient documentation

## 2017-01-30 DIAGNOSIS — M79672 Pain in left foot: Secondary | ICD-10-CM | POA: Insufficient documentation

## 2017-01-30 NOTE — ED Provider Notes (Signed)
[facility] Emergency Department Provider Note   ____________________________________________   First MD Initiated Contact with Patient 01/30/17 [phone]     (approximate)  I have reviewed the triage vital signs and the nursing notes.   HISTORY  Chief Complaint Foot Pain    HPI Philip Gomez is a 41 y.o. male patient complaining of continued left foot pain status post surgery performed in October last year. Patient to internal fixation of the fifth metatarsal. Patient state he noticed lateral foot swelling and increased pain with radiation issues and prolonged standing. Patient bleed internal fixation has dislodged.patient rates his pain as a 3/10. Patient has not discussed this complaint with his treating podiatrist.   Past Medical History:  Diagnosis Date  . Medical history non-contributory     There are no active problems to display for this patient.   Past Surgical History:  Procedure Laterality Date  . APPENDECTOMY    . BACK SURGERY     L4-5 "scraped"  . OPEN REDUCTION INTERNAL FIXATION (ORIF) FOOT LISFRANC FRACTURE Left 07/11/2016   Procedure: OPEN REDUCTION INTERNAL FIXATION (ORIF) FOOT METATARSAL FRACTURE 5TH LEFT FOOT;  Surgeon: Gwyneth Revels, DPM;  Location: [facility];  Service: Podiatry;  Laterality: Left;  POPLITEAL    Prior to Admission medications   Medication Sig Start Date End Date Taking? Authorizing Provider  dexmethylphenidate (FOCALIN) 10 MG tablet Take 10 mg by mouth 2 (two) times daily.    [provider]  ibuprofen (ADVIL,MOTRIN) 800 MG tablet Take 1 tablet (800 mg total) by mouth every 8 (eight) hours as needed. 07/11/16   Gwyneth Revels, DPM    Allergies Penicillins  No family history on file.  Social History Social History  Substance Use Topics  . Smoking status: Never Smoker  . Smokeless tobacco: Never Used  . Alcohol use 3.6 oz/week    6 Cans of beer per week    Review of  Systems  Constitutional: No fever/chills Eyes: No visual changes. ENT: No sore throat. Cardiovascular: Denies chest pain. Respiratory: Denies shortness of breath. Gastrointestinal: No abdominal pain.  No nausea, no vomiting.  No diarrhea.  No constipation. Genitourinary: Negative for dysuria. Musculoskeletal: left foot pain Skin: Negative for rash. Neurological: Negative for headaches, focal weakness or numbness.   ____________________________________________   PHYSICAL EXAM:  VITAL SIGNS: ED Triage Vitals  Enc Vitals Group     BP 01/30/17 0757 (!) 130/95     Pulse Rate 01/30/17 0757 91     Resp 01/30/17 0757 18     Temp 01/30/17 0757 98 F (36.7 C)     Temp Source 01/30/17 0757 Oral     SpO2 01/30/17 0757 98 %     Weight 01/30/17 0757 215 lb (97.5 kg)     Height 01/30/17 0757 6' (1.829 m)     Head Circumference --      Peak Flow --      Pain Score 01/30/17 0756 3     Pain Loc --      Pain Edu? --      Excl. in GC? --     Constitutional: Alert and oriented. Well appearing and in no acute distress. Cardiovascular: Normal rate, regular rhythm. Grossly normal heart sounds.  Good peripheral circulation. Respiratory: Normal respiratory effort.  No retractions. Lungs CTAB. Musculoskeletal:no obvious deformity of the left foot. There is a palpable lesion lateral aspect of the left foot. Moderate guarding palpation the lateral foot. Neurologic:  Normal speech and language. No  gross focal neurologic deficits are appreciated. No gait instability. Skin:  Skin is warm, dry and intact. No rash noted. Psychiatric: Mood and affect are normal. Speech and behavior are normal.  ____________________________________________   LABS (all labs ordered are listed, but only abnormal results are displayed)  Labs Reviewed - No data to display ____________________________________________  EKG   ____________________________________________  RADIOLOGY  Dg Foot Complete Left  Result  Date: 01/30/2017 CLINICAL DATA:  Left foot pain after surgery in November. Swelling. No injury. EXAM: LEFT FOOT - COMPLETE 3+ VIEW COMPARISON:  None. FINDINGS: A single screw traverses a healed fracture of the fifth metatarsal shaft. There is bony overgrowth laterally, which may contribute to the reported sensation of swelling. Mild fragmentation of the base of the fifth metatarsal, likely postoperative in etiology. Calcaneal spurs. IMPRESSION: Single screw fixation of a healed fifth metatarsal shaft fracture without acute finding. Electronically Signed   By: Leanna BattlesMelinda  Blietz M.D.   On: 01/30/2017 08:39    ____________________________________________   PROCEDURES  Procedure(s) performed: None  Procedures  Critical Care performed: No  ____________________________________________   INITIAL IMPRESSION / ASSESSMENT AND PLAN / ED COURSE  Pertinent labs & imaging results that were available during my care of the patient were reviewed by me and considered in my medical decision making (see chart for details).  Continual left foot pain status post internal fixation. Discussed x-ray findings of with patient showing extra bone growth lateral to the internal fixation. Advised to follow-up with podiatry consider purchasing a wider shoe      ____________________________________________   FINAL CLINICAL IMPRESSION(S) / ED DIAGNOSES  Final diagnoses:  Foot pain, left      NEW MEDICATIONS STARTED DURING THIS VISIT:  Discharge Medication List as of 01/30/2017  8:54 AM       Note:  This document was prepared using Dragon voice recognition software and may include unintentional dictation errors.    Joni ReiningSmith, Ronald K, PA-C 01/30/17 [id number]    Nita SickleVeronese, [place], MD 02/02/17 1302

## 2017-01-30 NOTE — ED Triage Notes (Signed)
Pt reports he had a left foot injury in October of last year and since then pt has been having issues with his left foot hurting. Pt had surgery on foot for injury with screws placed

## 2017-01-30 NOTE — Discharge Instructions (Signed)
Considered purchase over a wide his shoe to accommodate actual bone growth of left foot.

## 2017-01-30 NOTE — ED Notes (Signed)
Pt to ed with c/o left foot pain and swelling.  Reports surgery on foot in 10/17.  No new injury noted per pt.  Pt with swelling to left outer aspect of foot and mild redness noted.

## 2017-05-06 ENCOUNTER — Emergency Department: Payer: Self-pay

## 2017-05-06 ENCOUNTER — Encounter: Payer: Self-pay | Admitting: Emergency Medicine

## 2017-05-06 ENCOUNTER — Emergency Department
Admission: EM | Admit: 2017-05-06 | Discharge: 2017-05-06 | Disposition: A | Discharge status: Home / Self Care | Payer: Self-pay | Attending: Emergency Medicine | Admitting: Emergency Medicine

## 2017-05-06 DIAGNOSIS — Z880 Allergy status to penicillin: Secondary | ICD-10-CM | POA: Insufficient documentation

## 2017-05-06 DIAGNOSIS — Z79899 Other long term (current) drug therapy: Secondary | ICD-10-CM | POA: Insufficient documentation

## 2017-05-06 DIAGNOSIS — R0789 Other chest pain: Secondary | ICD-10-CM | POA: Insufficient documentation

## 2017-05-06 LAB — BASIC METABOLIC PANEL
ANION GAP: 10 (ref 5–15)
BUN: 14 mg/dL (ref 6–20)
CALCIUM: 9.2 mg/dL (ref 8.9–10.3)
CO2: 27 mmol/L (ref 22–32)
Chloride: 101 mmol/L (ref 101–111)
Creatinine, Ser: 1.09 mg/dL (ref 0.61–1.24)
Glucose, Bld: 92 mg/dL (ref 65–99)
Potassium: 3.9 mmol/L (ref 3.5–5.1)
Sodium: 138 mmol/L (ref 135–145)

## 2017-05-06 LAB — CBC
HCT: 45.5 % (ref 40.0–52.0)
HEMOGLOBIN: 15.9 g/dL (ref 13.0–18.0)
MCH: 31.1 pg (ref 26.0–34.0)
MCHC: 34.9 g/dL (ref 32.0–36.0)
MCV: 89.2 fL (ref 80.0–100.0)
Platelets: 269 10*3/uL (ref 150–440)
RBC: 5.1 MIL/uL (ref 4.40–5.90)
RDW: 13.3 % (ref 11.5–14.5)
WBC: 8.9 10*3/uL (ref 3.8–10.6)

## 2017-05-06 LAB — TROPONIN I

## 2017-05-06 MED ORDER — GI COCKTAIL ~~LOC~~
30.0000 mL | Freq: Once | ORAL | Status: AC
  Administered 2017-05-06: 30 mL via ORAL
  Filled 2017-05-06: qty 30

## 2017-05-06 MED ORDER — FAMOTIDINE 20 MG PO TABS: 20.0000 mg | ORAL_TABLET | Freq: Two times a day (BID) | ORAL | 0 refills | Status: DC

## 2017-05-06 NOTE — Discharge Instructions (Signed)
Fortunately today your chest x-ray, EKG, and your blood work were very reassuring. Please make an appointment to establish care with a primary care physician within one week for reevaluation. Return to the emergency department sooner for any concerns whatsoever.  It was a pleasure to take care of you today, and thank you for coming to our emergency department.  If you have any questions or concerns before leaving please ask the nurse to grab me and I'm more than happy to go through your aftercare instructions again.  If you were prescribed any opioid pain medication today such as Norco, Vicodin, Percocet, morphine, hydrocodone, or oxycodone please make sure you do not drive when you are taking this medication as it can alter your ability to drive safely.  If you have any concerns once you are home that you are not improving or are in fact getting worse before you can make it to your follow-up appointment, please do not hesitate to call 911 and come back for further evaluation.  Merrily Brittle, MD  Results for orders placed or performed during the hospital encounter of 05/06/17  Basic metabolic panel  Result Value Ref Range   Sodium 138 135 - 145 mmol/L   Potassium 3.9 3.5 - 5.1 mmol/L   Chloride 101 101 - 111 mmol/L   CO2 27 22 - 32 mmol/L   Glucose, Bld 92 65 - 99 mg/dL   BUN 14 6 - 20 mg/dL   Creatinine, Ser 1.61 0.61 - 1.24 mg/dL   Calcium 9.2 8.9 - 09.6 mg/dL   GFR calc non Af Amer >60 >60 mL/min   GFR calc Af Amer >60 >60 mL/min   Anion gap 10 5 - 15  CBC  Result Value Ref Range   WBC 8.9 3.8 - 10.6 K/uL   RBC 5.10 4.40 - 5.90 MIL/uL   Hemoglobin 15.9 13.0 - 18.0 g/dL   HCT 04.5 40.9 - 81.1 %   MCV 89.2 80.0 - 100.0 fL   MCH 31.1 26.0 - 34.0 pg   MCHC 34.9 32.0 - 36.0 g/dL   RDW 91.4 78.2 - 95.6 %   Platelets 269 150 - 440 K/uL  Troponin I  Result Value Ref Range   Troponin I <0.03 <0.03 ng/mL   Dg Chest 2 View  Result Date: 05/06/2017 CLINICAL DATA:  LEFT chest pain and  shortness of breath for 1 year. EXAM: CHEST  2 VIEW COMPARISON:  None. FINDINGS: Cardiomediastinal silhouette is normal. No pleural effusions or focal consolidations. Trachea projects midline and there is no pneumothorax. Soft tissue planes and included osseous structures are non-suspicious. IMPRESSION: Normal chest. Electronically Signed   By: Awilda Metro M.D.   On: 05/06/2017 18:49

## 2017-05-06 NOTE — ED Triage Notes (Signed)
Patient presents to ED via POV from home with c/o CP for a year. Patient states it has progressively gotten worse. Patient also reports SOB. Even and non labored respirations noted.

## 2017-05-06 NOTE — ED Provider Notes (Signed)
[facility] Emergency Department Provider Note  ____________________________________________   First MD Initiated Contact with Patient 05/06/17 1844     (approximate)  I have reviewed the triage vital signs and the nursing notes.   HISTORY  Chief Complaint Chest Pain    HPI Philip Gomez is a 41 y.o. male who comes to the emergency department with 1 year of intermittent atypical chest pain. The pain is in his left lower chest and he describes as "a bubbling feeling like her opening a bottle of soda". It is nonexertional. It is not associated with shortness of breath. It is not ripping or tearing and does not go to his back. He has no leg swelling. No recent travel surgery or immobilization. No hemoptysis. No history of deep vein thrombosis or pulmonary embolism. He has no particular abdominal pain. He's had some nausea but no vomiting. He has some dry cough in the morning. He has never seen a doctor regarding the symptoms.   Past Medical History:  Diagnosis Date  . Medical history non-contributory     There are no active problems to display for this patient.   Past Surgical History:  Procedure Laterality Date  . APPENDECTOMY    . BACK SURGERY     L4-5 "scraped"  . OPEN REDUCTION INTERNAL FIXATION (ORIF) FOOT LISFRANC FRACTURE Left 07/11/2016   Procedure: OPEN REDUCTION INTERNAL FIXATION (ORIF) FOOT METATARSAL FRACTURE 5TH LEFT FOOT;  Surgeon: Gwyneth Revels, DPM;  Location: [facility];  Service: Podiatry;  Laterality: Left;  POPLITEAL    Prior to Admission medications   Medication Sig Start Date End Date Taking? Authorizing Provider  dexmethylphenidate (FOCALIN) 10 MG tablet Take 10 mg by mouth 2 (two) times daily.    [provider]  famotidine (PEPCID) 20 MG tablet Take 1 tablet (20 mg total) by mouth 2 (two) times daily. 05/06/17 05/06/18  Merrily Brittle, MD  ibuprofen (ADVIL,MOTRIN) 800 MG tablet Take 1 tablet (800 mg  total) by mouth every 8 (eight) hours as needed. 07/11/16   Gwyneth Revels, DPM    Allergies Penicillins  No family history on file.  Social History Social History  Substance Use Topics  . Smoking status: Never Smoker  . Smokeless tobacco: Never Used  . Alcohol use 3.6 oz/week    6 Cans of beer per week    Review of Systems Constitutional: No fever/chills Eyes: No visual changes. ENT: No sore throat. Cardiovascular: positive for chest pain. Respiratory: negative for shortness of breath. Gastrointestinal: No abdominal pain.  positive for nausea, no vomiting.  No diarrhea.  No constipation. Genitourinary: Negative for dysuria. Musculoskeletal: Negative for back pain. Skin: Negative for rash. Neurological: Negative for headaches, focal weakness or numbness.   ____________________________________________   PHYSICAL EXAM:  VITAL SIGNS: ED Triage Vitals [05/06/17 1731]  Enc Vitals Group     BP (!) 142/101     Pulse Rate 81     Resp 15     Temp 98.2 F (36.8 C)     Temp src      SpO2 98 %     Weight 207 lb (93.9 kg)     Height  (1.854 m)     Head Circumference      Peak Flow      Pain Score 3     Pain Loc      Pain Edu?      Excl. in GC?     Constitutional: alert and oriented 4 well appearing  nontoxic no diaphoresis speaks in full clear sentences Eyes: PERRL EOMI. Head: Atraumatic. Nose: No congestion/rhinnorhea. Mouth/Throat: No trismus Neck: No stridor.   Cardiovascular: Normal rate, regular rhythm. Grossly normal heart sounds.  Good peripheral circulation. Respiratory: Normal respiratory effort.  No retractions. Lungs CTAB and moving good air Gastrointestinal: soft nondistended nontender no rebound no guarding no peritonitis Musculoskeletal: No lower extremity edema   Neurologic:  Normal speech and language. No gross focal neurologic deficits are appreciated. Skin:  Skin is warm, dry and intact. No rash noted. Psychiatric: Mood and affect are  normal. Speech and behavior are normal.    ____________________________________________   DIFFERENTIAL includes but not limited to  acid reflux, esophagitis, esophageal spasm, coronary syndrome, pulmonary embolus, pneumothorax, aortic dissection ____________________________________________   LABS (all labs ordered are listed, but only abnormal results are displayed)  Labs Reviewed  BASIC METABOLIC PANEL  CBC  TROPONIN I    pulmonary reviewed and interpreted by me and no signs of acute ischemia noted __________________________________________  EKG  ED ECG REPORT I, Merrily Brittle, the attending physician, personally viewed and interpreted this ECG.  Date: 05/06/2017 EKG Time:  Rate: 74 Rhythm: normal sinus rhythm QRS Axis: normal Intervals: normal ST/T Wave abnormalities: normal Narrative Interpretation: no evidence of acute ischemia  ____________________________________________  RADIOLOGY  Annia Friendly a reviewed by me with no acute disease ____________________________________________   PROCEDURES  Procedure(s) performed: no  Procedures  Critical Care performed: no  Observation: no ____________________________________________   INITIAL IMPRESSION / ASSESSMENT AND PLAN / ED COURSE  Pertinent labs & imaging results that were available during my care of the patient were reviewed by me and considered in my medical decision making (see chart for details).  The patient arrives with 1 year of intermittent extremely atypical chest pain. EKG is normal, chest x-ray is normal, blood work is reassuring. He feels somewhat improved after a GI cocktail. I will refer him to the [facility] to establish care with a primary care physician. Strict return precautions given. The patient verbalizes understanding and agreement with the plan.      ____________________________________________   FINAL CLINICAL IMPRESSION(S) / ED DIAGNOSES  Final diagnoses:  Atypical  chest pain      NEW MEDICATIONS STARTED DURING THIS VISIT:  New Prescriptions   FAMOTIDINE (PEPCID) 20 MG TABLET    Take 1 tablet (20 mg total) by mouth 2 (two) times daily.     Note:  This document was prepared using Dragon voice recognition software and may include unintentional dictation errors.     Merrily Brittle, MD 05/06/17 Mikle Bosworth

## 2017-05-06 NOTE — ED Notes (Signed)
Dr. Lamont Snowball at bedside assessing patient and gave verbal order to discontinue cardiac monitor and saline lock.

## 2017-10-30 IMAGING — DX DG FOOT COMPLETE 3+V*L*
3 series · 3 of 3 positions shown · non-contrast
Comparison: None.

CLINICAL DATA: Left foot pain after surgery in [REDACTED]. Swelling.
No injury.

EXAM:
LEFT FOOT - COMPLETE 3+ VIEW

[foot ap]
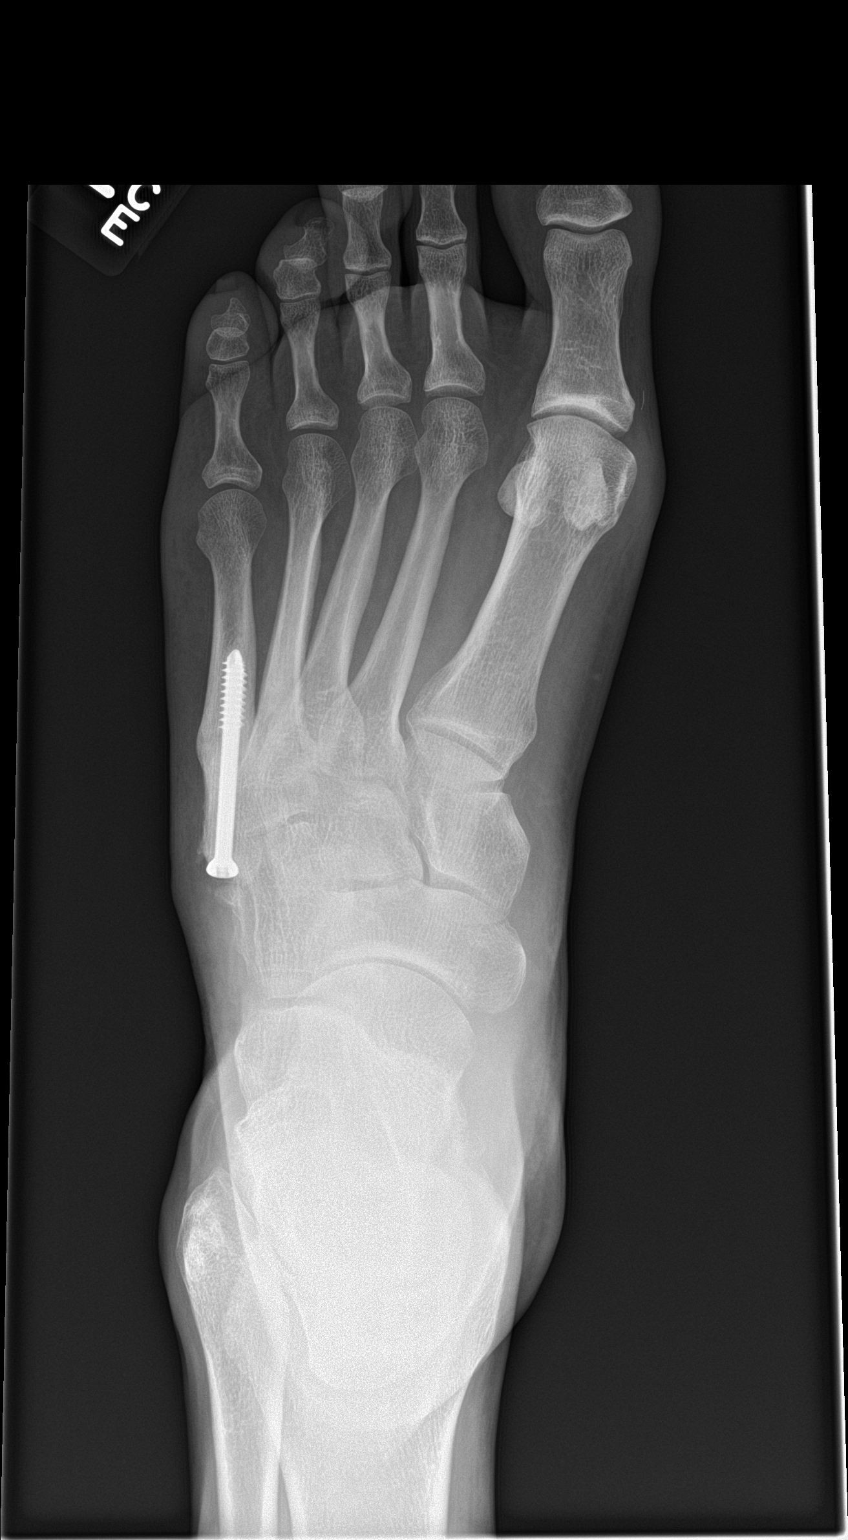

[foot obl]
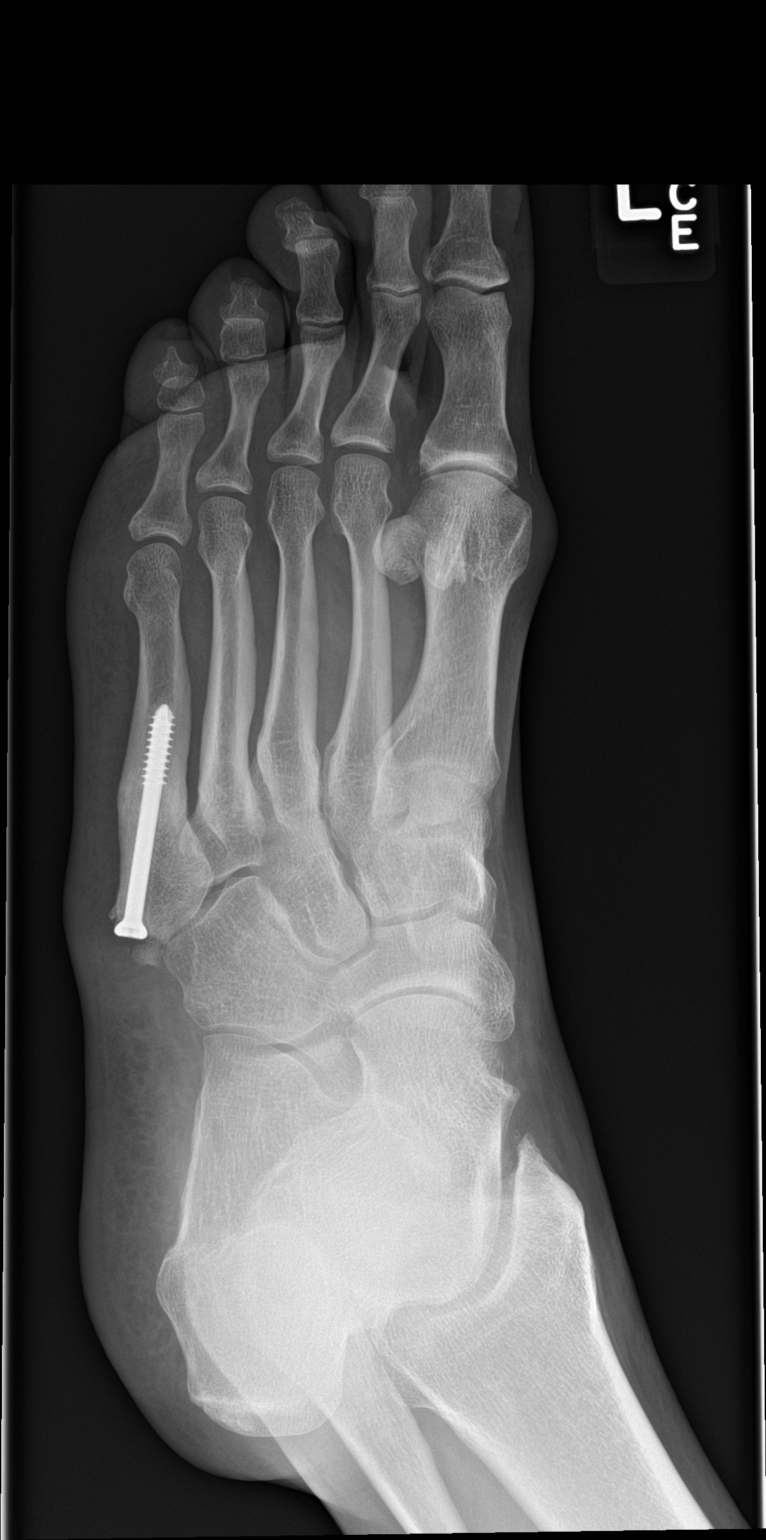

[foot lat]
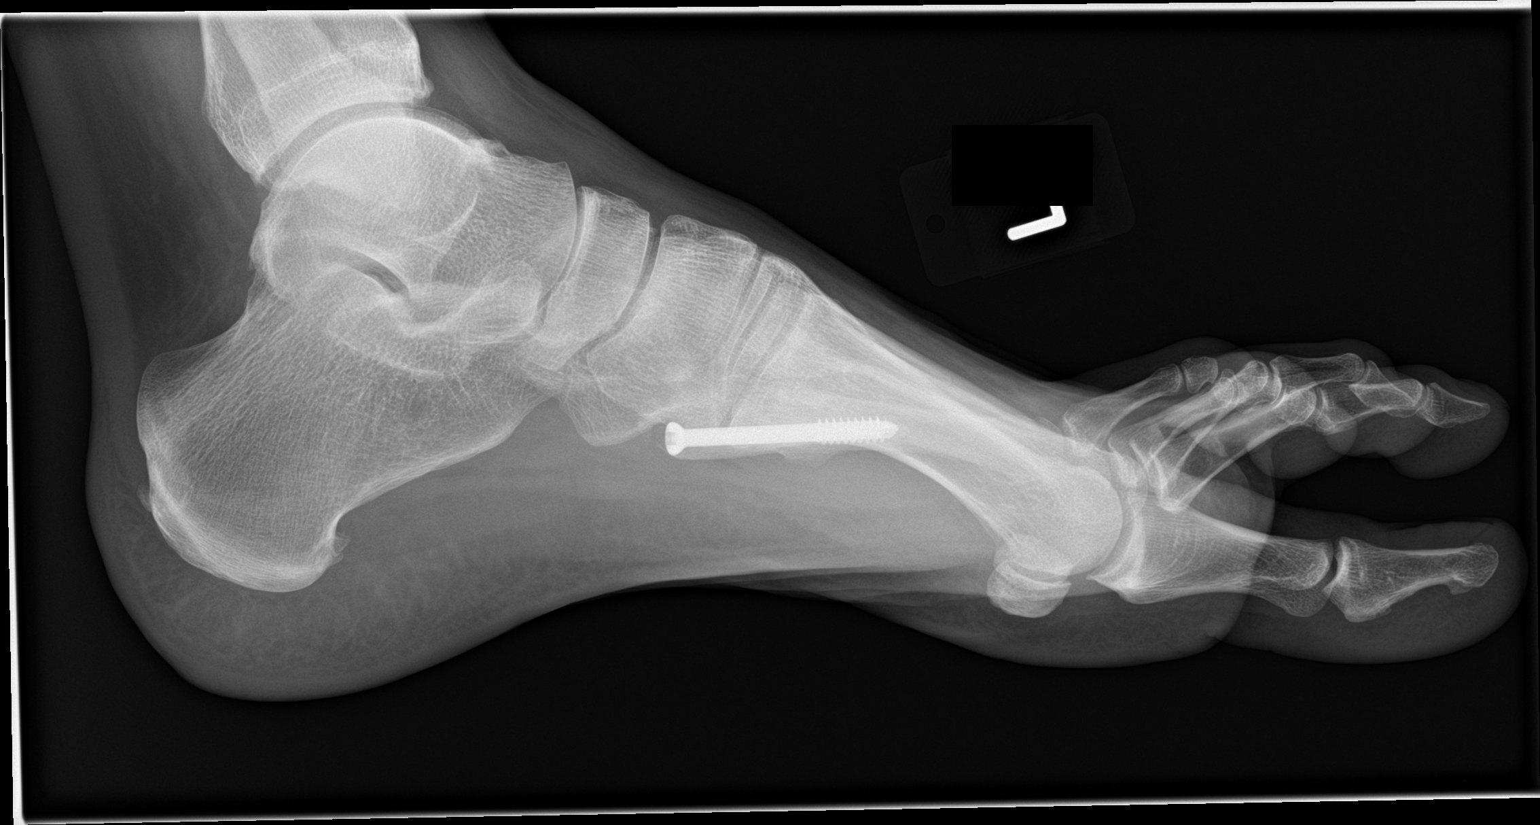

[3 of 3 positions shown; findings below may reference images not displayed]

FINDINGS: A single screw traverses a healed fracture of the fifth metatarsal
shaft. There is bony overgrowth laterally, which may contribute to
the reported sensation of swelling. Mild fragmentation of the base
of the fifth metatarsal, likely postoperative in etiology. Calcaneal
spurs.
IMPRESSION: Single screw fixation of a healed fifth metatarsal shaft fracture
without acute finding.

## 2019-09-27 ENCOUNTER — Emergency Department (HOSPITAL_COMMUNITY)
Admission: EM | Admit: 2019-09-27 | Discharge: 2019-09-27 | Disposition: A | Discharge status: Home / Self Care | Payer: BC Managed Care – PPO | Attending: Emergency Medicine | Admitting: Emergency Medicine

## 2019-09-27 ENCOUNTER — Encounter (HOSPITAL_COMMUNITY): Payer: Self-pay | Admitting: Emergency Medicine

## 2019-09-27 ENCOUNTER — Other Ambulatory Visit: Payer: Self-pay

## 2019-09-27 DIAGNOSIS — X781XXA Intentional self-harm by knife, initial encounter: Secondary | ICD-10-CM | POA: Insufficient documentation

## 2019-09-27 DIAGNOSIS — Z7289 Other problems related to lifestyle: Secondary | ICD-10-CM

## 2019-09-27 DIAGNOSIS — F331 Major depressive disorder, recurrent, moderate: Secondary | ICD-10-CM | POA: Diagnosis not present

## 2019-09-27 DIAGNOSIS — Z79899 Other long term (current) drug therapy: Secondary | ICD-10-CM | POA: Diagnosis not present

## 2019-09-27 DIAGNOSIS — Z20822 Contact with and (suspected) exposure to covid-19: Secondary | ICD-10-CM | POA: Insufficient documentation

## 2019-09-27 DIAGNOSIS — F1210 Cannabis abuse, uncomplicated: Secondary | ICD-10-CM

## 2019-09-27 DIAGNOSIS — F4325 Adjustment disorder with mixed disturbance of emotions and conduct: Secondary | ICD-10-CM | POA: Diagnosis not present

## 2019-09-27 DIAGNOSIS — Z046 Encounter for general psychiatric examination, requested by authority: Secondary | ICD-10-CM | POA: Diagnosis present

## 2019-09-27 LAB — SARS CORONAVIRUS 2 (TAT 6-24 HRS): SARS Coronavirus 2: NEGATIVE

## 2019-09-27 LAB — CBC WITH DIFFERENTIAL/PLATELET
Abs Immature Granulocytes: 0.04 10*3/uL (ref 0.00–0.07)
Basophils Absolute: 0 10*3/uL (ref 0.0–0.1)
Basophils Relative: 0 %
Eosinophils Absolute: 0.1 10*3/uL (ref 0.0–0.5)
Eosinophils Relative: 1 %
HCT: 47 % (ref 39.0–52.0)
Hemoglobin: 15.8 g/dL (ref 13.0–17.0)
Immature Granulocytes: 0 %
Lymphocytes Relative: 26 %
Lymphs Abs: 2.7 10*3/uL (ref 0.7–4.0)
MCH: 28.5 pg (ref 26.0–34.0)
MCHC: 33.6 g/dL (ref 30.0–36.0)
MCV: 84.7 fL (ref 80.0–100.0)
Monocytes Absolute: 0.7 10*3/uL (ref 0.1–1.0)
Monocytes Relative: 7 %
Neutro Abs: 6.8 10*3/uL (ref 1.7–7.7)
Neutrophils Relative %: 66 %
Platelets: 308 10*3/uL (ref 150–400)
RBC: 5.55 MIL/uL (ref 4.22–5.81)
RDW: 13.8 % (ref 11.5–15.5)
WBC: 10.4 10*3/uL (ref 4.0–10.5)
nRBC: 0 % (ref 0.0–0.2)

## 2019-09-27 LAB — BASIC METABOLIC PANEL
Anion gap: 13 (ref 5–15)
BUN: 15 mg/dL (ref 6–20)
CO2: 23 mmol/L (ref 22–32)
Calcium: 9.7 mg/dL (ref 8.9–10.3)
Chloride: 107 mmol/L (ref 98–111)
Creatinine, Ser: 1.05 mg/dL (ref 0.61–1.24)
GFR calc Af Amer: >60 mL/min (ref >60)
GFR calc non Af Amer: >60 mL/min (ref >60)
Glucose, Bld: 119 mg/dL — ABNORMAL HIGH (ref 70–99)
Potassium: 4 mmol/L (ref 3.5–5.1)
Sodium: 143 mmol/L (ref 135–145)

## 2019-09-27 LAB — RAPID URINE DRUG SCREEN, HOSP PERFORMED
Amphetamines: NOT DETECTED
Barbiturates: NOT DETECTED
Benzodiazepines: NOT DETECTED
Cocaine: NOT DETECTED
Opiates: NOT DETECTED
Tetrahydrocannabinol: POSITIVE — AB

## 2019-09-27 LAB — ETHANOL: Alcohol, Ethyl (B): 74 mg/dL — ABNORMAL HIGH (ref <10)

## 2019-09-27 LAB — ACETAMINOPHEN LEVEL: Acetaminophen (Tylenol), Serum: <10 ug/mL — ABNORMAL LOW (ref 10–30)

## 2019-09-27 LAB — SALICYLATE LEVEL: Salicylate Lvl: <7 mg/dL — ABNORMAL LOW (ref 7.0–30.0)

## 2019-09-27 MED ORDER — TETANUS-DIPHTH-ACELL PERTUSSIS 5-2.5-18.5 LF-MCG/0.5 IM SUSP
0.5000 mL | Freq: Once | INTRAMUSCULAR | Status: AC
  Administered 2019-09-27: 0.5 mL via INTRAMUSCULAR
  Filled 2019-09-27: qty 0.5

## 2019-09-27 MED ORDER — LORAZEPAM 1 MG PO TABS
1.0000 mg | ORAL_TABLET | Freq: Once | ORAL | Status: AC
  Administered 2019-09-27: 1 mg via ORAL
  Filled 2019-09-27: qty 1
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
## 2019-09-27 NOTE — ED Notes (Signed)
[phone] Weston Brass

## 2019-09-27 NOTE — ED Notes (Signed)
Pt verbalizes understanding instructions, He is aware to call his son.

## 2019-09-27 NOTE — ED Notes (Signed)
Tele pschy has been completed. Waiting for notes to be placed in chart and discharge instrucitons

## 2019-09-27 NOTE — ED Notes (Signed)
Patient appears irritated and keeps expressing that he is ready to leave. Advised patient that we are waiting for him to be medically cleared by psych. Patient requesting cell phone to call his son, writer advised patient that he could use phone in the room but he is not allowed to have his cell phone at this time. Patient in agreement to wait for psych MD at this time

## 2019-09-27 NOTE — ED Notes (Signed)
Called TTS to expedite consult

## 2019-09-27 NOTE — Progress Notes (Signed)
CSW faxed outpatient resources to [facility] ED for pt once discharged.   Ruthann Cancer MSW, LCSWA Clincal Social Worker Disposition  [facility] Ph: [phone] Fax: [phone]

## 2019-09-27 NOTE — ED Notes (Signed)
[phone] Philip Gomez
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
## 2019-09-27 NOTE — BHH Suicide Risk Assessment (Cosign Needed Addendum)
Suicide Risk Assessment  Discharge Assessment   [facility] Discharge Suicide Risk Assessment   Principal Problem: Adjustment disorder with mixed disturbance of emotions and conduct Discharge Diagnoses: Principal Problem:   Adjustment disorder with mixed disturbance of emotions and conduct Active Problems:   Cannabis use disorder, mild, abuse   Total Time spent with patient: 30 minutes  Musculoskeletal: Strength & Muscle Tone: within normal limits Gait & Station: normal Patient leans: N/A  Psychiatric Specialty Exam:   Blood pressure (!) 152/107, pulse 72, temperature (!) 97.4 F (36.3 C), temperature source Oral, resp. rate 18, height 6' (1.829 m), weight 95.3 kg, SpO2 100 %.Body mass index is 28.48 kg/m.  General Appearance: Casual  Eye Contact::  Good  Speech:  Clear and Coherent and Normal Rate409  Volume:  Normal  Mood:  "Fine" Appropriate  Affect:  Appropriate and Congruent  Thought Process:  Coherent, Goal Directed and Descriptions of Associations: Intact  Orientation:  Full (Time, Place, and Person)  Thought Content:  WDL  Suicidal Thoughts:  No  Homicidal Thoughts:  No  Memory:  Immediate;   Good Recent;   Good  Judgement:  Intact  Insight:  Present  Psychomotor Activity:  Normal  Concentration:  Good  Recall:  Good  Fund of Knowledge:Good  Language: Good  Akathisia:  No  Handed:  Left  AIMS (if indicated):     Assets:  Communication Skills Desire for Improvement Housing Social Support Transportation  Sleep:     Cognition: WNL  ADL's:  Intact   Mental Status Per Nursing Assessment::   On Admission:    Philip Gomez, 44 y.o., male patient seen via tele psych by this provider, Dr. Darleene Cleaver; and chart reviewed on 09/27/19.  On evaluation Philip Gomez reports he made superficial cuts to wrist yesterday when upset with wife after finding out she was seeing someone else.  States this is not the first time and he and his wife have separated twice before.   States that he has spoken to his wife and they are working things out.  States he is feeling much better this morning after a good nights sleep.  At this time patient denies suicidal/self-harm/homicidal ideation, psychosis, and paranoia.  Patient gave permission to speak with his wife Philip Gomez at [phone]).  During evaluation Philip Gomez is alert/oriented x 4; calm/cooperative; and mood is congruent with affect.  He does not appear to be responding to internal/external stimuli or delusional thoughts.  Patient denies suicidal/self-harm/homicidal ideation, psychosis, and paranoia.  Patient answered question appropriately.   Collateral Information: Spoke with Lesle Reek patients wife:  She states that she and patient have spoke with each other.  States that she doesn't feel that the patient is a danger to himself or her and it is fine for patient to come home.  Santiago Glad also states that she will make sure that patient follows up with outpatient psychiatric services.    Demographic Factors:  Male and Caucasian  Loss Factors: Loss of significant relationship  Historical Factors: NA  Risk Reduction Factors:   Responsible for children under 59 years of age, Sense of responsibility to family, Religious beliefs about death, Employed, Living with another person, especially a relative and Positive social support  Continued Clinical Symptoms:  Adjustment disorder with mixed disturbance of emotions and conduct  Cognitive Features That Contribute To Risk:  None    Suicide Risk:  Minimal: No identifiable suicidal ideation.  Patients presenting with no risk factors but with morbid ruminations;  may be classified as minimal risk based on the severity of the depressive symptoms  Follow-up Information    [facility]. Call.   Why: Walk in Monday - Friday 8am to 3pm call first related to Covid-19 Contact information: [address] [place] [address] [phone]        [facility] [employer]. Call.   Specialty: Professional Counselor Why: Walk in Monday-Friday 8am-3pm. Call first for appointment related to Covid [address] Contact information: [facility] of the [place] [address] [place] [address] [phone]           Plan Of Care/Follow-up recommendations:  Activity:  As tolerated Diet:  Heart healthy   Disposition:  Patient psychiatrically cleared No evidence of imminent risk to self or others at present.   Patient does not meet criteria for psychiatric inpatient admission. Supportive therapy provided about ongoing stressors. Discussed crisis plan, support from social network, calling 911, coming to the Emergency Department, and calling Suicide Hotline.  Lexiana Spindel, NP 09/27/2019, 11:53 AM

## 2019-09-27 NOTE — ED Triage Notes (Signed)
Patient cut with razor then kitchen knif his left wrist with intention for stress relief  after discovering his wife was with another man's house. HX of burning self with cigarette and cutting for Stress relief. Had marijuana and ETOH tonight.

## 2019-09-27 NOTE — ED Notes (Signed)
Patient belonging bags (2) place at Triage desk Nurse tech Miachel Roux made aware.
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
## 2020-12-15 ENCOUNTER — Encounter (HOSPITAL_COMMUNITY): Payer: Self-pay

## 2020-12-15 ENCOUNTER — Other Ambulatory Visit: Payer: Self-pay

## 2020-12-15 ENCOUNTER — Emergency Department (HOSPITAL_COMMUNITY)
Admission: EM | Admit: 2020-12-15 | Discharge: 2020-12-15 | Disposition: A | Discharge status: Home / Self Care | Payer: 59 | Attending: Emergency Medicine | Admitting: Emergency Medicine

## 2020-12-15 DIAGNOSIS — F419 Anxiety disorder, unspecified: Secondary | ICD-10-CM | POA: Insufficient documentation

## 2020-12-15 NOTE — Discharge Instructions (Signed)
Followup with your doctor as needed
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
## 2020-12-15 NOTE — ED Notes (Signed)
ED Provider at bedside. 

## 2022-04-12 DIAGNOSIS — I10 Essential (primary) hypertension: Secondary | ICD-10-CM | POA: Diagnosis not present

## 2022-04-12 DIAGNOSIS — R21 Rash and other nonspecific skin eruption: Secondary | ICD-10-CM | POA: Diagnosis not present

## 2022-04-12 DIAGNOSIS — G479 Sleep disorder, unspecified: Secondary | ICD-10-CM | POA: Diagnosis not present

## 2022-04-12 DIAGNOSIS — M79631 Pain in right forearm: Secondary | ICD-10-CM | POA: Diagnosis not present

## 2022-09-24 DIAGNOSIS — B354 Tinea corporis: Secondary | ICD-10-CM | POA: Diagnosis not present

## 2022-09-24 DIAGNOSIS — I10 Essential (primary) hypertension: Secondary | ICD-10-CM | POA: Diagnosis not present

## 2022-09-24 DIAGNOSIS — Z1211 Encounter for screening for malignant neoplasm of colon: Secondary | ICD-10-CM | POA: Diagnosis not present

## 2022-09-24 DIAGNOSIS — G479 Sleep disorder, unspecified: Secondary | ICD-10-CM | POA: Diagnosis not present

## 2022-09-24 DIAGNOSIS — M7712 Lateral epicondylitis, left elbow: Secondary | ICD-10-CM | POA: Diagnosis not present
